# Patient Record
Sex: Male | Born: 1945 | Race: White | Hispanic: No
Health system: Southern US, Community
[De-identification: ages and names within clinical notes are randomized; demographics above are authoritative.]

---

## 2005-08-30 ENCOUNTER — Ambulatory Visit: Payer: Self-pay | Admitting: Pain Medicine

## 2005-11-11 ENCOUNTER — Ambulatory Visit: Payer: Self-pay | Admitting: Internal Medicine

## 2005-12-05 ENCOUNTER — Other Ambulatory Visit: Payer: Self-pay

## 2005-12-05 ENCOUNTER — Inpatient Hospital Stay: Payer: Self-pay | Admitting: Internal Medicine

## 2005-12-13 ENCOUNTER — Emergency Department: Payer: Self-pay | Admitting: Emergency Medicine

## 2006-05-14 ENCOUNTER — Emergency Department: Payer: Self-pay | Admitting: Emergency Medicine

## 2006-05-23 ENCOUNTER — Emergency Department (HOSPITAL_COMMUNITY): Admission: EM | Admit: 2006-05-23 | Discharge: 2006-05-23 | Payer: Self-pay | Admitting: Emergency Medicine

## 2006-06-09 ENCOUNTER — Ambulatory Visit: Payer: Self-pay | Admitting: Physician Assistant

## 2011-12-24 ENCOUNTER — Ambulatory Visit: Payer: Self-pay | Admitting: Family Medicine

## 2012-07-15 ENCOUNTER — Inpatient Hospital Stay: Payer: Self-pay | Admitting: Internal Medicine

## 2012-07-15 LAB — URINALYSIS, COMPLETE
Bacteria: NONE SEEN
Bilirubin,UR: NEGATIVE
Glucose,UR: NEGATIVE mg/dL (ref 0–75)
Ketone: NEGATIVE
Leukocyte Esterase: NEGATIVE
Nitrite: NEGATIVE
Ph: 6 (ref 4.5–8.0)
RBC,UR: NONE SEEN /HPF (ref 0–5)
Specific Gravity: 1.003 (ref 1.003–1.030)
Squamous Epithelial: NONE SEEN

## 2012-07-15 LAB — COMPREHENSIVE METABOLIC PANEL
BUN: 11 mg/dL (ref 7–18)
Bilirubin,Total: 0.8 mg/dL (ref 0.2–1.0)
Chloride: 99 mmol/L (ref 98–107)
EGFR (Non-African Amer.): >60
Osmolality: 271 (ref 275–301)
Potassium: 3.5 mmol/L (ref 3.5–5.1)
SGPT (ALT): 44 U/L (ref 12–78)
Sodium: 135 mmol/L — ABNORMAL LOW (ref 136–145)
Total Protein: 7.3 g/dL (ref 6.4–8.2)

## 2012-07-15 LAB — CBC
HGB: 12.9 g/dL — ABNORMAL LOW (ref 13.0–18.0)
MCHC: 34.5 g/dL (ref 32.0–36.0)
RDW: 12.3 % (ref 11.5–14.5)

## 2012-07-16 LAB — BASIC METABOLIC PANEL
Anion Gap: 7 (ref 7–16)
BUN: 8 mg/dL (ref 7–18)
Calcium, Total: 8.2 mg/dL — ABNORMAL LOW (ref 8.5–10.1)
Chloride: 101 mmol/L (ref 98–107)
Co2: 27 mmol/L (ref 21–32)
Creatinine: 0.98 mg/dL (ref 0.60–1.30)
EGFR (African American): >60
EGFR (Non-African Amer.): >60
Glucose: 98 mg/dL (ref 65–99)
Osmolality: 268 (ref 275–301)
Potassium: 3.5 mmol/L (ref 3.5–5.1)
Sodium: 135 mmol/L — ABNORMAL LOW (ref 136–145)

## 2012-07-16 LAB — CBC WITH DIFFERENTIAL/PLATELET
Basophil #: 0.1 10*3/uL (ref 0.0–0.1)
Eosinophil #: 0.3 10*3/uL (ref 0.0–0.7)
Lymphocyte #: 1.9 10*3/uL (ref 1.0–3.6)
MCH: 32.5 pg (ref 26.0–34.0)
MCV: 93 fL (ref 80–100)
Neutrophil %: 63.2 %
RDW: 12.1 % (ref 11.5–14.5)

## 2012-07-17 LAB — BASIC METABOLIC PANEL
BUN: 8 mg/dL (ref 7–18)
Calcium, Total: 8 mg/dL — ABNORMAL LOW (ref 8.5–10.1)
EGFR (African American): >60
Potassium: 3.3 mmol/L — ABNORMAL LOW (ref 3.5–5.1)

## 2012-07-17 LAB — CBC WITH DIFFERENTIAL/PLATELET
Basophil #: 0.1 10*3/uL (ref 0.0–0.1)
Eosinophil %: 5.9 %
HCT: 35.4 % — ABNORMAL LOW (ref 40.0–52.0)
Lymphocyte #: 2.3 10*3/uL (ref 1.0–3.6)
Lymphocyte %: 27.3 %
MCH: 32.3 pg (ref 26.0–34.0)
MCV: 94 fL (ref 80–100)
RBC: 3.79 10*6/uL — ABNORMAL LOW (ref 4.40–5.90)
RDW: 12.4 % (ref 11.5–14.5)

## 2012-07-17 LAB — VANCOMYCIN, TROUGH: Vancomycin, Trough: 15 ug/mL (ref 10–20)

## 2012-07-18 LAB — CBC WITH DIFFERENTIAL/PLATELET
Eosinophil #: 0.5 10*3/uL (ref 0.0–0.7)
HCT: 38.3 % — ABNORMAL LOW (ref 40.0–52.0)
MCH: 31.3 pg (ref 26.0–34.0)
MCHC: 33.5 g/dL (ref 32.0–36.0)
Monocyte %: 13.6 %
Neutrophil #: 4.7 10*3/uL (ref 1.4–6.5)
Neutrophil %: 54 %
Platelet: 320 10*3/uL (ref 150–440)
RDW: 12.4 % (ref 11.5–14.5)
WBC: 8.7 10*3/uL (ref 3.8–10.6)

## 2012-07-18 LAB — BASIC METABOLIC PANEL
BUN: 6 mg/dL — ABNORMAL LOW (ref 7–18)
Calcium, Total: 8.4 mg/dL — ABNORMAL LOW (ref 8.5–10.1)
Chloride: 104 mmol/L (ref 98–107)

## 2012-07-19 LAB — VANCOMYCIN, TROUGH: Vancomycin, Trough: 21 ug/mL (ref 10–20)

## 2012-08-29 ENCOUNTER — Encounter: Payer: Self-pay | Admitting: Cardiothoracic Surgery

## 2012-08-29 ENCOUNTER — Encounter: Payer: Self-pay | Admitting: Nurse Practitioner

## 2013-09-16 ENCOUNTER — Emergency Department: Payer: Self-pay | Admitting: Emergency Medicine

## 2013-09-22 ENCOUNTER — Emergency Department: Payer: Self-pay | Admitting: Internal Medicine

## 2013-12-13 ENCOUNTER — Emergency Department: Payer: Self-pay | Admitting: Emergency Medicine

## 2013-12-15 ENCOUNTER — Emergency Department: Payer: Self-pay | Admitting: Emergency Medicine

## 2014-01-06 ENCOUNTER — Emergency Department: Payer: Self-pay | Admitting: Emergency Medicine

## 2014-01-07 LAB — BASIC METABOLIC PANEL
Anion Gap: 5 — ABNORMAL LOW (ref 7–16)
BUN: 16 mg/dL (ref 7–18)
Calcium, Total: 8.3 mg/dL — ABNORMAL LOW (ref 8.5–10.1)
Chloride: 107 mmol/L (ref 98–107)
Co2: 28 mmol/L (ref 21–32)
Creatinine: 1.23 mg/dL (ref 0.60–1.30)
EGFR (Non-African Amer.): 60 — ABNORMAL LOW
GLUCOSE: 101 mg/dL — AB (ref 65–99)
OSMOLALITY: 281 (ref 275–301)
Potassium: 4.3 mmol/L (ref 3.5–5.1)
Sodium: 140 mmol/L (ref 136–145)

## 2014-01-07 LAB — CBC
HCT: 39.2 % — AB (ref 40.0–52.0)
HGB: 12.9 g/dL — AB (ref 13.0–18.0)
MCH: 32.7 pg (ref 26.0–34.0)
MCHC: 33 g/dL (ref 32.0–36.0)
MCV: 99 fL (ref 80–100)
PLATELETS: 169 10*3/uL (ref 150–440)
RBC: 3.97 10*6/uL — ABNORMAL LOW (ref 4.40–5.90)
RDW: 13.2 % (ref 11.5–14.5)
WBC: 5.9 10*3/uL (ref 3.8–10.6)

## 2014-01-07 LAB — TROPONIN I: Troponin-I: <0.02 ng/mL

## 2014-01-11 ENCOUNTER — Emergency Department: Payer: Self-pay | Admitting: Emergency Medicine

## 2014-01-11 LAB — CBC
HCT: 38.9 % — ABNORMAL LOW (ref 40.0–52.0)
HGB: 12.5 g/dL — AB (ref 13.0–18.0)
MCH: 31.8 pg (ref 26.0–34.0)
MCHC: 32.2 g/dL (ref 32.0–36.0)
MCV: 99 fL (ref 80–100)
PLATELETS: 159 10*3/uL (ref 150–440)
RBC: 3.93 10*6/uL — AB (ref 4.40–5.90)
RDW: 13 % (ref 11.5–14.5)
WBC: 6.6 10*3/uL (ref 3.8–10.6)

## 2014-01-11 LAB — URINALYSIS, COMPLETE
BLOOD: NEGATIVE
Bacteria: NONE SEEN
Bilirubin,UR: NEGATIVE
Glucose,UR: NEGATIVE mg/dL (ref 0–75)
Ketone: NEGATIVE
LEUKOCYTE ESTERASE: NEGATIVE
NITRITE: NEGATIVE
PH: 7 (ref 4.5–8.0)
PROTEIN: NEGATIVE
RBC,UR: 1 /HPF (ref 0–5)
SQUAMOUS EPITHELIAL: NONE SEEN
Specific Gravity: 1.008 (ref 1.003–1.030)
WBC UR: NONE SEEN /HPF (ref 0–5)

## 2014-01-11 LAB — BASIC METABOLIC PANEL
Anion Gap: 5 — ABNORMAL LOW (ref 7–16)
BUN: 13 mg/dL (ref 7–18)
CHLORIDE: 109 mmol/L — AB (ref 98–107)
CO2: 27 mmol/L (ref 21–32)
CREATININE: 1.17 mg/dL (ref 0.60–1.30)
Calcium, Total: 8 mg/dL — ABNORMAL LOW (ref 8.5–10.1)
EGFR (Non-African Amer.): >60
GLUCOSE: 70 mg/dL (ref 65–99)
OSMOLALITY: 280 (ref 275–301)
Potassium: 4.4 mmol/L (ref 3.5–5.1)
SODIUM: 141 mmol/L (ref 136–145)

## 2014-01-11 LAB — ETHANOL

## 2014-01-26 ENCOUNTER — Observation Stay: Payer: Self-pay | Admitting: Internal Medicine

## 2014-01-26 LAB — URINALYSIS, COMPLETE
BACTERIA: NONE SEEN
BILIRUBIN, UR: NEGATIVE
Glucose,UR: NEGATIVE mg/dL (ref 0–75)
Ketone: NEGATIVE
LEUKOCYTE ESTERASE: NEGATIVE
Nitrite: NEGATIVE
PH: 5 (ref 4.5–8.0)
Protein: NEGATIVE
RBC,UR: 2 /HPF (ref 0–5)
Specific Gravity: 1.023 (ref 1.003–1.030)
Squamous Epithelial: NONE SEEN
WBC UR: 3 /HPF (ref 0–5)

## 2014-01-26 LAB — CBC WITH DIFFERENTIAL/PLATELET
BASOS ABS: 0 10*3/uL (ref 0.0–0.1)
Basophil %: 0.2 %
EOS ABS: 0.1 10*3/uL (ref 0.0–0.7)
Eosinophil %: 0.6 %
HCT: 39 % — ABNORMAL LOW (ref 40.0–52.0)
HGB: 12.7 g/dL — ABNORMAL LOW (ref 13.0–18.0)
Lymphocyte #: 1.5 10*3/uL (ref 1.0–3.6)
Lymphocyte %: 6.9 %
MCH: 31.9 pg (ref 26.0–34.0)
MCHC: 32.6 g/dL (ref 32.0–36.0)
MCV: 98 fL (ref 80–100)
MONO ABS: 2.7 x10 3/mm — AB (ref 0.2–1.0)
Monocyte %: 11.9 %
NEUTROS PCT: 80.4 %
Neutrophil #: 17.9 10*3/uL — ABNORMAL HIGH (ref 1.4–6.5)
PLATELETS: 358 10*3/uL (ref 150–440)
RBC: 3.99 10*6/uL — AB (ref 4.40–5.90)
RDW: 13.4 % (ref 11.5–14.5)
WBC: 22.2 10*3/uL — ABNORMAL HIGH (ref 3.8–10.6)

## 2014-01-26 LAB — DRUG SCREEN, URINE
Amphetamines, Ur Screen: NEGATIVE (ref ?–1000)
Barbiturates, Ur Screen: NEGATIVE (ref ?–200)
Benzodiazepine, Ur Scrn: POSITIVE (ref ?–200)
CANNABINOID 50 NG, UR ~~LOC~~: NEGATIVE (ref ?–50)
Cocaine Metabolite,Ur ~~LOC~~: NEGATIVE (ref ?–300)
MDMA (ECSTASY) UR SCREEN: NEGATIVE (ref ?–500)
METHADONE, UR SCREEN: NEGATIVE (ref ?–300)
Opiate, Ur Screen: POSITIVE (ref ?–300)
PHENCYCLIDINE (PCP) UR S: NEGATIVE (ref ?–25)
TRICYCLIC, UR SCREEN: NEGATIVE (ref ?–1000)

## 2014-01-26 LAB — COMPREHENSIVE METABOLIC PANEL
ALBUMIN: 3.1 g/dL — AB (ref 3.4–5.0)
ALK PHOS: 93 U/L
Anion Gap: 9 (ref 7–16)
BUN: 40 mg/dL — ABNORMAL HIGH (ref 7–18)
Bilirubin,Total: 0.5 mg/dL (ref 0.2–1.0)
CHLORIDE: 96 mmol/L — AB (ref 98–107)
CO2: 26 mmol/L (ref 21–32)
Calcium, Total: 8.2 mg/dL — ABNORMAL LOW (ref 8.5–10.1)
Creatinine: 1.35 mg/dL — ABNORMAL HIGH (ref 0.60–1.30)
EGFR (African American): >60
EGFR (Non-African Amer.): 56 — ABNORMAL LOW
GLUCOSE: 104 mg/dL — AB (ref 65–99)
Osmolality: 273 (ref 275–301)
Potassium: 4 mmol/L (ref 3.5–5.1)
SGOT(AST): 38 U/L — ABNORMAL HIGH (ref 15–37)
SGPT (ALT): 22 U/L
Sodium: 131 mmol/L — ABNORMAL LOW (ref 136–145)
TOTAL PROTEIN: 7.3 g/dL (ref 6.4–8.2)

## 2014-01-26 LAB — TROPONIN I
Troponin-I: <0.02 ng/mL
Troponin-I: <0.02 ng/mL
Troponin-I: <0.02 ng/mL

## 2014-01-26 LAB — ETHANOL: Ethanol: <3 mg/dL

## 2014-01-26 LAB — HEMOGLOBIN A1C: Hemoglobin A1C: 5.6 % (ref 4.2–6.3)

## 2014-01-30 LAB — CULTURE, BLOOD (SINGLE)

## 2014-01-30 LAB — EXPECTORATED SPUTUM ASSESSMENT W GRAM STAIN, RFLX TO RESP C

## 2014-08-09 NOTE — Op Note (Signed)
PATIENT NAME:  Paul MaizesGILSON, Prem MR#:  [id number] DATE OF BIRTH:  Feb 02, 1946  DATE OF PROCEDURE:  07/16/2012  PREOPERATIVE DIAGNOSIS:  Sacral decubitus ulcer with necrosis.   POSTOPERATIVE DIAGNOSIS:  Sacral decubitus ulcer with necrosis.   PROCEDURE PERFORMED:  Debridement of sacral decubitus ulcer, 5 x 3 cm, as well as debridement of adjacent decubitus ulcer, 1 x 1 cm.   ANESTHESIA:  LMA general.   ESTIMATED BLOOD LOSS:  10 mL.   COMPLICATIONS:  None.   SPECIMENS:  None.   INDICATION FOR SURGERY:  The patient is a pleasant 69 year old male with history of a fall and immobility. He is noted to have a pressure ulcer which is causing him pain. He is brought to the operating room for debridement.   DETAILS OF PROCEDURE:  The patient was brought to the operating room suite. He was laid supine on the operating room table. He was induced. Endotracheal tube was placed. General anesthesia was administered. A timeout was then performed correctly identifying the patient's name, operative site and procedure to be performed. His legs were put in lithotomy. His buttocks were prepped with Betadine. The wound was debrided down to subcutaneous tissue. It was irrigated and hemostasis was obtained. Local anesthetic of 1% lidocaine was used to infiltrate the lesion. The drapes were then taken down. Sterile dressing was placed. The patient was awakened, LMA was removed and the patient was brought to the postanesthesia care unit. There were no immediate complications. Needle, sponge and instrument counts were correct at the end of the procedure.    ____________________________ Si Raiderhristopher A. Leilyn Frayre, MD cal:si D: 07/16/2012 21:21:45 ET T: 07/16/2012 22:04:15 ET JOB#: [id number]  cc: Cristal Deerhristopher A. Lileigh Fahringer, MD, <Dictator> Jarvis NewcomerHRISTOPHER A Sharonda Llamas MD ELECTRONICALLY SIGNED 07/24/2012 10:02

## 2014-08-09 NOTE — Consult Note (Signed)
PATIENT NAME:  Paul Meyer, Paul Meyer MR#:  [id number] DATE OF BIRTH:  22-Nov-1945  DATE OF CONSULTATION:  07/15/2012  REFERRING PHYSICIAN:   CONSULTING PHYSICIAN:  Idella Lamontagne A. Kaliopi Blyden, MD  REASON FOR CONSULTATION:  Possible perirectal abscess seen on CT and a right buttock decubitus ulcer in need of debridement.   HISTORY OF PRESENT ILLNESS:  The patient is a pleasant 69 year old who lives at home with a past medical history of hypertension and diabetes who is brought here by ER.  He is wheelchair-bound and called the ED and presumptively said that he fell to the floor, had pain in his right hip and also an area of buttock pain.  He is unable to give me a clear complaint right now, just says that he has buttock pain, was noted to have bruising on left buttocks and a decubital ulcer on his right buttocks.  When I asked him did not complain of any rectal pain otherwise.   REVIEW OF SYSTEMS:  Unable to obtain due to patient confusion.   PAST MEDICAL HISTORY:  1.  Diabetes.  2.  Hypertension.  3.  History of chronic back pain.  4.  Two right hip replacements.   HOME MEDICATIONS: 1.  Valium.  2.  Tiotropium.  3.  Protonix.  4.  Preparation H.  5.  Percocet.  6.  Nicotine patch. 7.  Methadone.  8.  Lisinopril. 9.  Lescol. 10.  Colace.  11.  Celexa.  12.  Dulcolax.  13.  Advair Diskus.   ALLERGIES:  1.  NEURONTIN. 2.  BENADRYL.   SOCIAL HISTORY:  Unable to obtain, but from notes, lives at home and he is in a wheelchair.  Tobacco, alcohol use unable to obtain.   FAMILY HISTORY:  Unable to obtain.   PHYSICAL EXAMINATION: VITAL SIGNS:  Temperature 97.8, pulse of 84, blood pressure 197/106, respirations 18, 95% on room air.  GENERAL:  Confused, unable to answer questions appropriately.  No acute distress.  HEAD:  Normocephalic, atraumatic.  EYES:  No scleral icterus.  No conjunctivitis.  FACE:  No obvious facial trauma.  Normal external nose.  Normal external ears.  CHEST:  Lungs  clear to auscultation, moving air well.  HEART:  Regular rate and rhythm.  No murmurs, rubs, or gallops.  ABDOMEN:  Soft, nontender, nondistended.  BUTTOCK:  Large bruise to left buttocks, has an approximately 5 x 3 cm area of multiple decubitus ulcers with some necrotic tissue and a potential inferior abscess.   RECTAL:  Normal rectal exam.  Unable to palpate a mass.  Prostate feels normal.  Unable to elicit any pain with rectal exam.  EXTREMITIES:  Moves all extremities well.  Strength 5 out of 5.  NEUROLOGIC:  Cranial nerves II through XII are grossly intact.  Moves all extremities.   LABORATORY AND RADIOLOGIC STUDIES:  Labs are remarkable for a white cell count of 10.0, hemoglobin 12.9, hematocrit 37.2, platelets 309, otherwise labs are unremarkable.   CT shows a soft tissue density in left perirectal region above the levators high on the rectum.  Does not appear to be fluid-filled, but per radiologist was read as perirectal abscess.   ASSESSMENT AND PLAN:  The patient is a pleasant 69 year old male with history of diabetes, high blood pressure, comes in after a presumed fall, has a right buttock decubitus ulcer which is in need of debridement sometime when his encephalopathy has improved, if it is to improve.  Also, has a soft tissue density in his left area  which is nonpalpable and not able to be surgically drained, but appears to be above the levators, may consider interventional radiology draining and antibiotics, however he is asymptomatic at this time from this lesion.  We will continue to follow for possible future debridement.     ____________________________ Si Raider. Jane Broughton, MD cal:ea D: 07/15/2012 22:27:05 ET T: 07/15/2012 23:30:19 ET JOB#: [id number]  cc: Cristal Deer A. Opal Dinning, MD, <Dictator> Jarvis Newcomer MD ELECTRONICALLY SIGNED 07/16/2012 18:42

## 2014-08-09 NOTE — H&P (Signed)
DATE OF BIRTH:  [birth year]  DATE OF ADMISSION:  07/15/2012  PRIMARY CARE PHYSICIAN:  Dr. Lorie Phenix  CHIEF COMPLAINT:  Perirectal pain.   HISTORY OF PRESENT ILLNESS: A 69 year old male patient presented to the Emergency Room, brought in by EMS complaining of perirectal pain. The patient has had episodes of confusion in the hospital. Presently he is unable to contribute to the history, except his pointing at the pain. The patient was found to have a perirectal abscess along with a decubitus ulcer. Dr. Juliann Pulse from Surgery saw the patient, who did not feel this could be drained. He felt IV antibiotics would help, and a consultation with IR to consider drainage. The patient is being admitted for the same. The patient does not complain of anything or raise concerns, other than his perirectal pain. Initially when he presented to the Emergency Room, the patient did complain that he fell off his chair a few days back. The patient is mostly bedbound, and moves around in his wheelchair. His baseline status is unknown. Old records have been reviewed.   PAST MEDICAL HISTORY:  COPD, hypertension, diabetes, tobacco abuse, hyperlipidemia, depression, chronic low back pain, on narcotic medications, right hip replacement, tonsillectomy,  GERD, spinal stenosis.   ALLERGIES:  No known drug allergies.   FAMILY HISTORY:  Reviewed and unobtainable.   SOCIAL HISTORY: Old records mention that patient smoked a pack a day.   REVIEW OF SYSTEMS:  Unobtainable, except patient mentioning pain in the perirectal area.   HOME MEDICATIONS INCLUDE: 1.  Advair Diskus 250/50, 1 puff inhaled twice a day.  2.  Bisacodyl 10 mg rectal suppository as needed once a day.  3.  Celexa 10 mg oral once a day. 4.  Colace 100 mg oral 2 times a day.  5.  Lescol 20 mg oral once a day. 6.  Lisinopril 20 mg oral 2 times a day.  7.  Methadone 10 mg oral every 6 hours.  8. Protonix 40 mg oral once a day.  9. Spiriva 18 mcg inhaled  once a day.   FAMILY HISTORY:  Reviewed, unknown.   PHYSICAL EXAMINATION: VITAL SIGNS: Temperature 97.8, pulse of 88, respirations 16, blood pressure 153/95, saturating 94% on room air.  GENERAL:  Obese Caucasian male patient lying in bed, confused, restless.  PSYCHIATRIC:  Is confused, but not agitated. She is alert and awake.  HEENT:  Atraumatic, normocephalic. Oral mucosa dry and pink. External ears and nose normal. No pallor. No icterus. Pupils bilaterally equal and react to light.  NECK: Supple. No thyromegaly. No palpable lymph nodes. Trachea midline. No carotid bruit, JVD.  CARDIOVASCULAR: S1, S2. Regular rate and rhythm, without any murmurs. Peripheral pulses decreased. RESPIRATORY:  Normal work of breathing. Clear to auscultation on both sides.  GASTROINTESTINAL: Soft abdomen, nontender. Bowel sounds present. No hepatosplenomegaly palpable.  GENITOURINARY:  No CVA tenderness or bladder distention.  SKIN:  Warm and dry. Has a large decubitus ulcer in his gluteal fold  measuring about 2 x 4 cm deep, with some purulent discharge. Surrounding erythema.  MUSCULOSKELETAL:  No joint swelling, redness, effusion over the large joints. Normal muscle tone.  NEUROLOGICAL: Moves all 4 extremities symmetrically. Cranial nerves II to XII intact. LYMPHATIC: No cervical lymphadenopathy.   LAB STUDIES:  Show BNP of 312. Glucose 120, BUN 11, creatinine 0.92, sodium 135, potassium 3.5. AST, ALT, alkaline phosphatase normal. Albumin 2.3. Troponin less than 0.02. CK of 151. WBC 10, hemoglobin 12.9, platelets of 309. Urinalysis shows no bacteria, no WBC.  CT scan of the abdomen and pelvis, preliminary result, shows a perirectal abscess. No other acute abnormality found.   ASSESSMENT AND PLAN:  1.  Perirectal abscess, which is not amenable to surgical intervention. Case discussed with Dr. Juliann PulseLundquist. Will start patient on IV antibiotics, considering his associated acute encephalopathy. Will also need  consultation with Interventional Radiology in the morning to see if this can be drained. The patient also has a decubitus ulcer, which might need debridement. Will get blood cultures.   2.  Acute encephalopathy, likely secondary from the abscess, although patient's baseline status is not known. Will get a CT scan of the head to look for any acute bleed or stroke, considering his recent fall.   3.  Hypertension. Continue home medications. Use IV p.r.n. meds.   4.  Chronic obstructive pulmonary disease. Continue home inhalers and nebs p.r.n.   5.  Diabetes mellitus. Patient is, surprisingly, not on any diabetes medications at home. Will put him on a sliding scale insulin, diabetic diet, and check a HbA1c.   6.  Deep vein thrombosis prophylaxis with heparin.   7.  Code status:  Presumed FULL CODE.   Time spent today on this case was 45 minutes.     ____________________________ Molinda BailiffSrikar R. Aadan Chenier, MD srs:mr D: 07/15/2012 21:42:15 ET T: 07/15/2012 22:24:09 ET JOB#: [id number]  cc: Wardell HeathSrikar R. Elpidio AnisSudini, MD, <Dictator> Leo GrosserNancy J. Maloney, MD  Orie FishermanSRIKAR R Donshay Lupinski MD ELECTRONICALLY SIGNED 07/24/2012 15:00

## 2014-08-09 NOTE — Consult Note (Signed)
Brief Consult Note: Diagnosis: Depression and anxiety, Delirium secondary to medical condition/medications resolved.   Patient was seen by consultant.   Consult note dictated.   Recommend further assessment or treatment.   Comments: [name] does have the capacity to make decisions about his disposition. Please discharge as appropriate.  Electronic Signatures: Kristine LineaPucilowska, [name] (MD)  (Signed 04-Apr-14 19:29)  Authored: Brief Consult Note   Last Updated: 04-Apr-14 19:29 by Kristine LineaPucilowska, Rufus Beske (MD)

## 2014-08-09 NOTE — Discharge Summary (Signed)
PATIENT NAME:  Paul Meyer, Paul Meyer MR#:  [id number] DATE OF BIRTH:  [birth year]  DATE OF ADMISSION:  07/15/2012 DATE OF DISCHARGE:  07/21/2012  ADMITTING DIAGNOSIS: Perirectal pain.   DISCHARGE DIAGNOSES: 1.  Perirectal pain due to a nonoperable perirectal abscess as well as an ulcer in that region status post evaluation by surgery, recommended antibiotics, were unable to drain it.  2.  Acute encephalopathy felt to be due to acute infection and felt to be metabolic in nature.  The patient's delirium is improved.  3.  Hypertension.  4.  Chronic obstructive pulmonary disease.  5.  Diet-controlled diabetes.  6.  Chronic pain in the back with difficulty with ambulation.  7.  Tobacco abuse.  8.  Depression.  9.  Status post right hip replacement. 10.  Status post tonsillectomy.  11.  Gastroesophageal reflux disease.  12.  History of spinal stenosis.  13.  Abnormality noted in the left gluteal region.  The patient refused to get a second CT scan with intravenous contrast.    PERTINENT LABS AND EVALUATIONS: Admitting BMP: Glucose 120, BUN 11, creatinine 0.92, sodium was 135, potassium 3.5, chloride 99 and CO2 was 30. Hemoglobin A1c 5.6. LFTs were normal, except albumin of 2.3. CK-MB was 4.0. WBC 10.0, hemoglobin 12.9 and platelet count 309.   Blood cultures x 2 no growth.   EKG showed sinus rhythm without any ST-T wave changes.   CT scan of the head showed chronic and involutional changes without any acute abnormality.   CT of his pelvis showed left perirectal collection, possibly an abscess although hematoma cannot be excluded. Abnormality noted in the left gluteal musculature. May be infection or mass. Further evaluation with IV contrast recommended. No evidence of fracture. Abnormality noted in the left gluteal region. The patient refused to get day second CT scan. With IV contrast.   CONSULTANT: Ida Rogue, MD; case management.   HOSPITAL COURSE: Please refer to H and P done by the  admitting physician. The patient is a 69 year old white male who resides by himself at home, reports that he normally gets around in a wheelchair, but he has been able to ambulate here in the hospital with unsteady gait, who presented with severe perirectal pain. Surgery saw him initially and thought that this could be drained. We were asked to admit him for infection as well as abscess. The patient was started on broad-spectrum antibiotics. He was in severe pain requiring IV pain medications. He continued to require them. Also in terms of this perirectal lesion, he was seen by surgery. He was taken under anesthesia to the OR, but they were unable to drain that area. He was recommended to continue antibiotics. The patient's perirectal abscess and ulceration have significantly improved. He was switched over to oral medications. He also developed acute encephalopathy during hospitalization with severe agitation, probably due to infection. That has improved as well. He continues to have difficulty with ambulation. He is still getting wound care. He lives alone but did not want to go straight to nursing home. He requests transfer to the [place], which is currently being arranged.   DISCHARGE MEDICATIONS: 1.  Preparation H 3 times a day as needed. 2.  Celexa 10 mg daily. 3.  Protonix 40 mg daily. 4.  Lescol 20 mg 1 tab p.o. at bedtime. 5.  Lisinopril 20 mg 1 tab p.o. b.i.d. 6.  Methadone 10 mg 1 tab p.o. q. 6. 7.  Advair 250/50 one inhalation b.i.d. 8.  Nicotine patch 14 mg topically  daily. 9.  Colace 100 mg 1 tab p.o. b.i.d. 10.  Bisacodyl 1 suppository rectally daily. 11.  Spiriva 18 mcg daily. 12.  Tylenol 650 mg q. 4 p.r.n.  13.  Oxycodone 15 mg 1 tab p.o. q. 12, extended release. 14.  Augmentin 875 mg 1 tab p.o. b.i.d. x 7 days. 15.  Doxycycline 100 p.o. q. 12 hours x 7 days.  16.  Valium 5 mg 4 times per day as needed.  DRESSING CARE:  Wet to dry dressing on the buttock area ulceration daily.    DIET: Low sodium, low fat, low cholesterol, carbohydrate consistent.  ACTIVITY: As tolerated. PT and OT evaluation and treatment.  DISPOSITION:  [facility], [facility] 7-10 days  Referal : home health/pt/case manager  TIME SPENT:  35 minutes. ____________________________ Lacie ScottsShreyang H. Allena KatzPatel, MD shp:sb D: 07/21/2012 13:46:17 ET T: 07/21/2012 14:13:43 ET JOB#: [id number]  cc: Cristan Hout H. Allena KatzPatel, MD, <Dictator> Charise CarwinSHREYANG H Taggart Prasad MD ELECTRONICALLY SIGNED 07/22/2012 8:24

## 2014-08-09 NOTE — Consult Note (Signed)
DATE OF BIRTH:  22-Jun-1945  DATE OF ADMISSION:  07/15/2012  DATE OF CONSULTATION:  07/21/2012  REQUESTING PHYSICIAN:  Dr. Milagros Loll  CONSULTING PHYSICIAN:  Iliani Vejar B. Katrina Daddona, MD  REASON FOR CONSULTATION:  To assess the capacity to make decisions about disposition.   IDENTIFYING DATA:   The patient is a 69 year old male with a history of depression.   CHIEF COMPLAINT:  "I need to go home."   HISTORY OF PRESENT ILLNESS:  The patient is a veteran and has [facility] privileges, where he receives all his treatment. He reports that he has a history of depression and anxiety, and has been maintained at the [place] on Valium that he takes occasionally. On the list of his medications, there is also low-dose Celexa. He was admitted to the hospital for a perianal abscess that could not be drained, and was treated with antibiotics. The patient also reportedly experienced some symptoms suggestive of delirium that now has resolved. The patient wants to be discharged to home. The treating team believes that the patient needs to stay in the hospital longer. They offered him a choice of being transferred to a [facility] or a skilled nursing facility. The patient denied both, and insists that he will go home. He feels that he did not receive help he needed here, and does not feel that hospitalization here or elsewhere is necessary. He lives by himself, has a friend who reportedly helps him with groceries. He no longer drives, and does not have a car. He is disabled from back pain and hip problems, and uses a wheelchair around the house. He is confident that he can return home as usual. He denies any symptoms of depression, anxiety or psychosis. He denies alcohol, illicit drugs or prescription pill abuse.   PAST PSYCHIATRIC HISTORY:  There is a history of depression and anxiety. Some of it is related to difficulty growing up. He lost his parents very early Took care of himself. Was in the [employer] at the time of  [place] War, and made a comment that he was saving people instead of killing people. He denies ever attempting suicide. No history of substance abuse.   PAST MEDICAL HISTORY:  COPD, hypertension, diabetes, perirectal abscess.   ALLERGIES:  BENADRYL and NEURONTIN.   MEDICATIONS ON ADMISSION:  Advair Diskus 250/50 twice daily, bisacodyl 10 mg daily, Celexa 10 mg daily, Colace 100 mg twice daily, Lescol 20 mg daily, lisinopril 20 mg twice daily, methadone 10 mg every 6 hours, Protonix 40 mg daily, Spiriva 18 mcg daily.   MEDICATIONS AT THE TIME OF ASSESSMENT:  Tylenol 650 as needed, citalopram 10 mg daily, Colace 100 mg twice daily, Advair Diskus 250/50 twice daily, fluvastatin 20 mg at bedtime, heparin 5000 units twice daily, hydralazine injections as needed, insulin sliding scale, lisinopril 20 mg twice daily, methadone 10 mg every 6 hours Zofran as needed, pantoprazole 40 mg daily, Spiriva 1 capsule daily, morphine injection as needed, Geodon injection as needed, OxyContin 15 mg every 12 hours, Augmentin 875 mg twice daily, doxycycline 100 mg twice daily.  SOCIAL HISTORY:  As above, the patient is a [place] veteran. He lives by himself. He has difficulties ambulating, and uses a wheelchair at home. He reportedly has a friend who helps him out. He has other friends that he could engage if necessary. He knows some "ladies" who would take care of him if he pays them. He feels that he has resources to do so. He would welcome home health nurse  as well.   REVIEW OF SYSTEMS: CONSTITUTIONAL:  No fevers or chills. No weight changes.  EYES:  No double or blurred vision.  EARS, NOSE, THROAT:  No hearing loss.  RESPIRATORY:  No shortness of breath or cough.  CARDIOVASCULAR:  No chest pain or orthopnea.  GASTROINTESTINAL:  No abdominal pain, nausea, vomiting or diarrhea. Positive for perirectal pain.  GENITOURINARY:  No incontinence or frequency.  ENDOCRINE:  No heat or cold intolerance.  LYMPHATIC:  No  anemia or easy bruising.  INTEGUMENTARY:  No acne or rash.  MUSCULOSKELETAL:  Positive for back pain.  NEUROLOGIC:  No tingling or weakness.  PSYCHIATRIC:  See History of Present Illness for details.   PHYSICAL EXAMINATION: VITAL SIGNS:  Blood pressure 166/74, pulse 68, respirations 18, temperature 98.5.  GENERAL: This is a well-developed male in no acute distress.  The rest of the physical examination is deferred to his primary attending.   LABORATORY DATA:  Chemistries within normal limits, except for blood glucose of 120. Sodium 135. LFTs within normal limits. Cardiac enzymes negative. CBC:  White blood count 10.1, hemoglobin 11.9, hematocrit 34.1, platelets 305. No blood culture growth. Urinalysis is not suggestive of urinary tract infections.   EKG:  Sinus rhythm with premature atrial complexes. Otherwise normal EKG.   MENTAL STATUS EXAMINATION:  The patient is alert and oriented to person, place, time and situation. He is pleasant, polite and cooperative. He is sitting in a chair wearing private clothes. He is well kept. He maintains good eye contact. His speech is of normal rhythm, rate and volume. Mood is fine, with full affect. Thought processing is logical and goal-oriented. Thought content:  He denies suicidal or homicidal ideation. There are no delusions or paranoia. There are no auditory or visual hallucinations. His cognition is grossly intact. He registers 3 out of 3 and recalls 3 out of 3 objects after 5 minutes. He can spell "world" forward and backward. He knows the current president. His insight and judgment are fair.   SUICIDE RISK ASSESSMENT: This is a patient with a long history of mild depression and anxiety, who was admitted for perianal abscess, treated with antibiotics, and reportedly experienced some delirium, who is now resolved. He is forward-thinking and optimistic about the future.   DIAGNOSES: AXIS I: Major depressive disorder, recurrent, in remission. Delirium  secondary to medical condition/medications, resolved.  AXIS II:  Deferred.   AXIS III:   Chronic obstructive pulmonary disease. Gastroesophageal reflux disease. Back pain.  Perianal abscess. Hypertension   AXIS IV:   Physical illness, primary support.   AXIS V:  GAF 55.   PLAN:   1.  The patient does not meet criteria for involuntary inpatient psychiatric commitment. Please discharge as appropriate.  2.  He does have the capacity to make decision about his disposition.  3.  No medication adjustments recommended.  4.  The patient [facility].    ____________________________ Winda Summerall B. Jennet MaduroPucilowska, MD jbp:mr D: 07/21/2012 19:26:00 ET T: 07/21/2012 22:31:42 ET JOB#: [id number]  cc: Durand Wittmeyer B. Jennet MaduroPucilowska, MD, <Dictator> Shari ProwsJOLANTA B Roniel Halloran MD ELECTRONICALLY SIGNED 07/31/2012 21:39

## 2014-08-10 NOTE — H&P (Signed)
PATIENT NAME:  Paul Meyer, Paul Meyer MR#:  [id number] DATE OF BIRTH:  02-May-1945  DATE OF ADMISSION:  01/26/2014  REFERRING PHYSICIAN: Governor Rooks, MD  PRIMARY CARE PHYSICIAN: Lorie Phenix, MD  ADMISSION DIAGNOSIS: Left lower lobe pneumonia and altered mental status.   HISTORY OF PRESENT ILLNESS: This is a 69 year old Caucasian male who presents to the Emergency Department via EMS. He was found [place] at a [place] motel where bystanders reported that he was initially unresponsive. They did not start basic life support, but called 911 where first responders noted that he indeed was responsive, but certainly altered if not groggy. He was brought to the Emergency Department, where a chief complaint was not really discernible from the patient's own words, which are garbled. He initially seemed to be in mild respiratory distress, which prompted the Emergency Department staff to obtain a chest x-ray that showed left lower lobe pneumonia. Due to his altered mental status and pneumonia, we were called for admission.   REVIEW OF SYSTEMS: The patient is not cooperating with physical exam or answering questions. It is clear that he complains of some back pain and mentions something about "spinal", which I assume is a reference to spinal stenosis that I have gathered from his old records. He does not participate in a full review of systems.   PAST MEDICAL HISTORY: Gleaned from previous medical records, hypertension, diabetes type 2, COPD, hyperlipidemia, depression, tobacco abuse and chronic narcotic medication use.   PAST SURGICAL HISTORY: As taken from old records, right hip replacement as well as tonsillectomy.   SOCIAL HISTORY: The patient is a smoker. Nothing else is known about his acquaintances and  social circumstances at this time.   FAMILY HISTORY: Not available at this time.   MEDICATIONS:  1. Acetaminophen 325 mg 2 tabs p.o. every 4 hours as needed for temperature greater than 100.4.  2.  Acetaminophen/oxycodone 325 mg/5 mg oral tablet 1 tab every 6 hours as needed for pain.  3. Advair Diskus 250 mcg/50 mcg inhaled powder 1 capsule inhaled b.i.d.  4. Atorvastatin 40 mg 1 tab p.o. daily at bedtime.  5. Bactrim Double Strength 800 mg/160 mg oral tablet 1 tab p.o. b.i.d. presumably x 10 days.  6. Bisacodyl 10 mg rectal suppository 1 per day at bedtime as needed for constipation.  7. Celexa 10 mg 1 tab p.o. daily.  8. Colace 100 mg 1 capsule p.o. b.i.d.  9. Cyanocobalamin 1000 mcg tablet, 1 tablet p.o. daily.  10. Lescol 20 mg oral capsule 1 capsule p.o. daily at bedtime.  11. Lisinopril 20 mg 1 tab p.o. b.i.d.  12. Metoprolol 50 mg 1 tab p.o. b.i.d.  13. Olanzapine 10 mg 1 tab p.o. once daily.   ALLERGIES: BENADRYL AND NEURONTIN.   PERTINENT LABORATORY RESULTS AND RADIOGRAPHIC FINDINGS: Glucose 104, BUN 40, creatinine 1.35, sodium 131, potassium 4,  chloride 96, CO2 26, calcium is 8.2, serum albumin is 3.1, alkaline phosphatase 93, AST 38, ALT 22. Troponin is negative. White blood cell count is 22.2, hemoglobin is 12.7, hematocrit is 39, platelet count is normal. Urinalysis is also negative for infection. Chest x-ray shows left lower lobe infiltrate consistent with pneumonia as well as COPD and bibasilar densities most likely to be overlying shadows of soft tissues on the chest. The CT scan of his head without contrast shows no acute intracranial abnormality, but he does have some mild sinusitis involving the ethmoidal and right axillary sinus.   PHYSICAL EXAMINATION:  VITAL SIGNS: Temperature is 100.1, pulse 99, respirations  16, blood pressure 129/58, pulse oximetry is 94 to 97% on 2 liters oxygen via nasal cannula.   GENERAL: The patient is somnolent and oriented to person, but it is unclear if he is oriented to place or situation. He does not appear to be in any distress, but certainly looks uncomfortable and has a posture similar to somebody who has a hemiparesis.  HEENT:  Normocephalic, atraumatic. Pupils equal, round, and reactive to light and accommodation. Extraocular movements are intact, although be it noted that the patient's eyes are somewhat rolled upward into his head but he will focus if aroused significantly. Mucous membranes are dry.  NECK: Trachea is midline. No adenopathy.  CHEST: Symmetric, atraumatic.  CARDIOVASCULAR: Regular rate and rhythm. Normal S1, S2. No rubs, clicks, murmurs appreciated.  LUNGS: Clear to auscultation bilaterally. Normal effort and excursion.  ABDOMEN: Positive bowel sounds. Soft, nondistended mildly, but diffusely tender with no rebound tenderness or voluntary guarding.  GENITOURINARY: Normal external male genitalia.  MUSCULOSKELETAL: The patient moves all 4 extremities equally but he does not participate in strength testing.  SKIN: No rashes or lesions.  NEUROLOGIC: Cranial nerves II through V appears to be grossly intact. The patient did not participate in full exam.  PSYCHIATRIC: Mood is frustrated and affect is congruent.   ASSESSMENT AND PLAN: This is a 69 year old Caucasian male who appears dramatically older than his stated age who was found in a decreased responsive state who currently has a left lower lobe pneumonia.   1. Pneumonia of left lower lobe, possibly aspiration pneumonia as here in the Emergency Department the patient has some dried vomit on his bedsheets. It is likely secondary to his altered mental status, the etiology of which is unknown at this time. Initially the Emergency Department had given him some Rocephin and azithromycin, but due to his overall appearance one might extrapolate that his state of health is somewhat immunocompromised so I have added vancomycin to his regimen as well. We will supply supplemental oxygen as needed to maintain oxygen saturations greater than 94%.  2. Altered mental status, unclear if this is due to hypoxia secondary to pneumonia or if the patient has some toxicity that is  currently unknown as we are not able to obtain a urine toxicology screen. His ethanol level was notably low at 3. I have ordered a dose of naloxone for the patient here in the Emergency Department to see if this improves his mental status. If the patient is still mildly obtunded in the morning the primary care team may wish to order an MRI of his brain.  3. Sepsis. The patient meets criteria for sepsis due to respiratory rate and leukocytosis. We have obtained blood cultures and will follow growth and sensitivities to change antibiotic coverage as needed.  4. Hyponatremia. Likely secondary to volume depletion as the patient does not appear to take care of himself well. I will give him intravenous fluid at maintenance rate.  5. Acute kidney injury secondary to above. Current GFR is 56. Once we get the patient back to being euvolemic we will recheck his kidney function.  6. Chronic obstructive pulmonary disease. Restart Advair once the patient's  mental status is more alert. Currently he has excellent air movement and no wheezes on physical examination, but if need be we will do Duoneb treatments in addition to his maintenance inhaler.  7. Diabetes mellitus type 2. Currently patient's sugar is only mildly elevated. I will add sliding scale insulin regimen to his inpatient medications.  8. Hyperlipidemia. Continue statin once the patient is able to take medications by mouth.  9. Tobacco abuse. I will add NicoDerm patch for the patient while he is hospitalized.  10. Depression. Currently the patient is on olanzapine at home, but for the time being, he will receive  Haldol as naloxone has made him very active and mildly combative with nursing staff.  11. Deep vein thrombosis prophylaxis. Subcutaneous heparin.  12. Gastrointestinal prophylaxis unnecessary as the patient is not critically ill.   CODE STATUS: The patient is currently a full code.   TIME SPENT ON ADMISSION ORDERS AND PATIENT CARE:  Approximately 30 minutes.      ____________________________ Kelton Pillar. Sheryle Hail, MD msd:JT D: 01/26/2014 03:11:42 ET T: 01/26/2014 04:38:34 ET JOB#: [id number]  cc: Kelton Pillar. Sheryle Hail, MD, <Dictator> Kelton Pillar Sola Margolis MD ELECTRONICALLY SIGNED 01/27/2014 0:08

## 2014-08-10 NOTE — Discharge Summary (Signed)
PATIENT NAME:  Paul Meyer, Paul Meyer MR#:  [id number] DATE OF BIRTH:  06-05-1945  DATE OF ADMISSION:  01/26/2014 DATE OF DISCHARGE:  01/27/2014  The patient was admitted on October 10 for community-acquired pneumonia and ambulatory dysfunction. He apparently left AMA on this evening.    ____________________________ Marena Witts P. Juliene PinaMody, MD spm:TT D: 01/27/2014 11:06:14 ET T: 01/27/2014 15:29:32 ET JOB#: [id number]  cc: Terralyn Matsumura P. Juliene PinaMody, MD, <Dictator> Janyth ContesSITAL P Xayne Brumbaugh MD ELECTRONICALLY SIGNED 01/27/2014 21:25

## 2014-12-19 ENCOUNTER — Other Ambulatory Visit: Payer: Self-pay | Admitting: Family Medicine

## 2015-01-19 IMAGING — CR LEFT WRIST - COMPLETE 3+ VIEW
1 series · 3 of 3 positions shown · non-contrast
Comparison: None.

CLINICAL DATA: Found down on floor, initially unresponsive. Left
wrist pain. Initial encounter.

EXAM:
LEFT WRIST - COMPLETE 3+ VIEW

[Series 1: pa · 0.17mm/px · 3 of 3 slices shown]
[im 1/3]
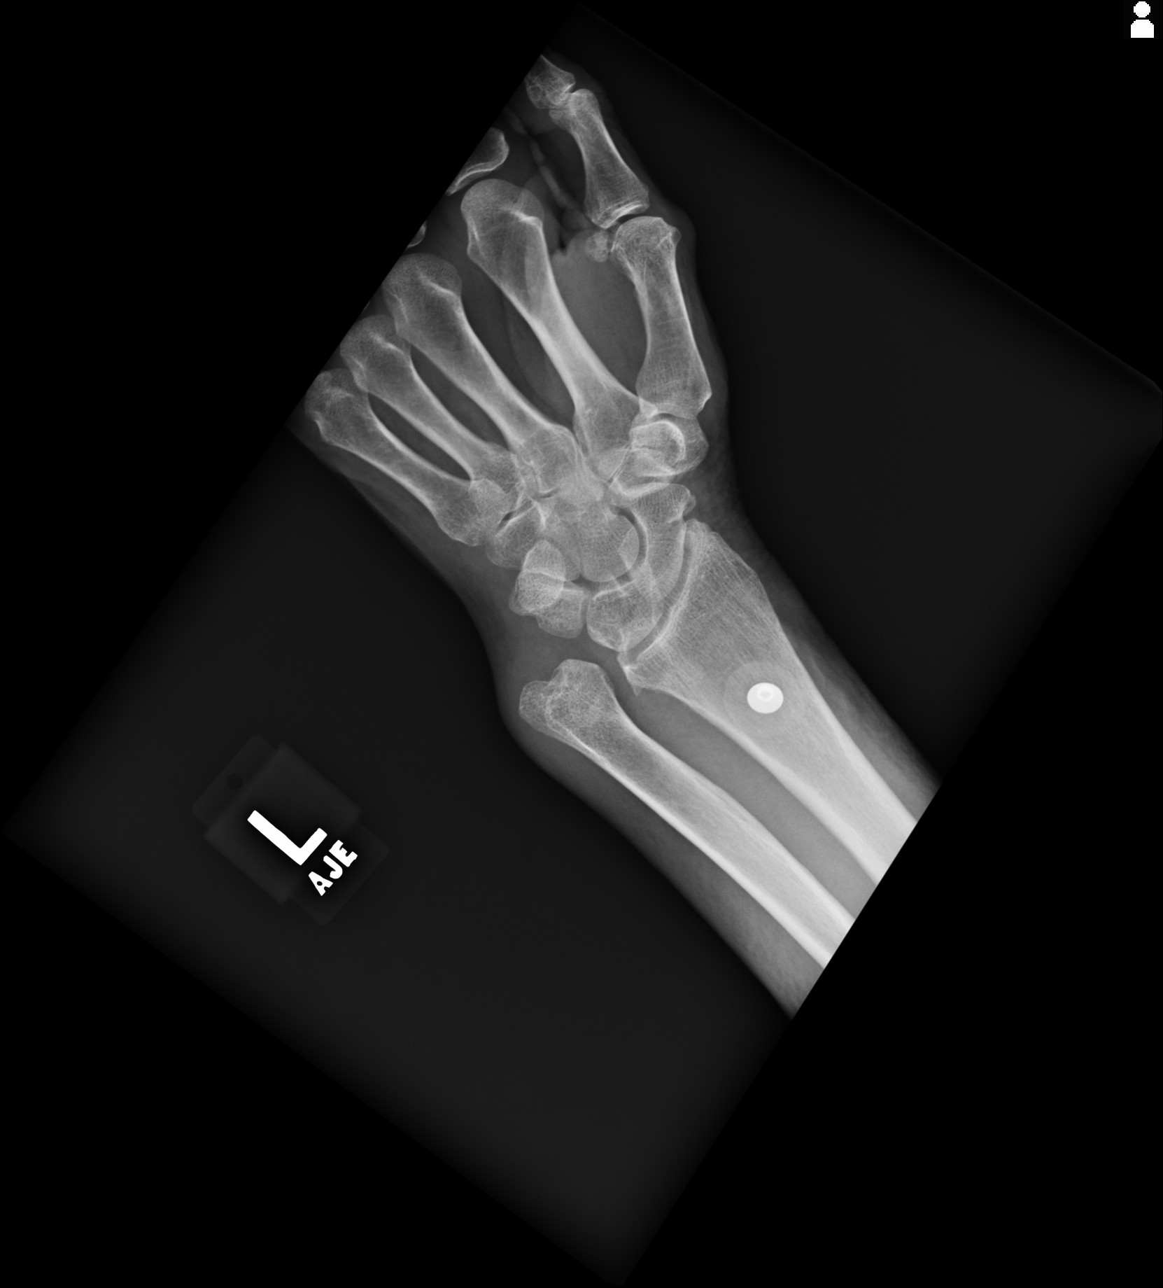
[im 2/3]
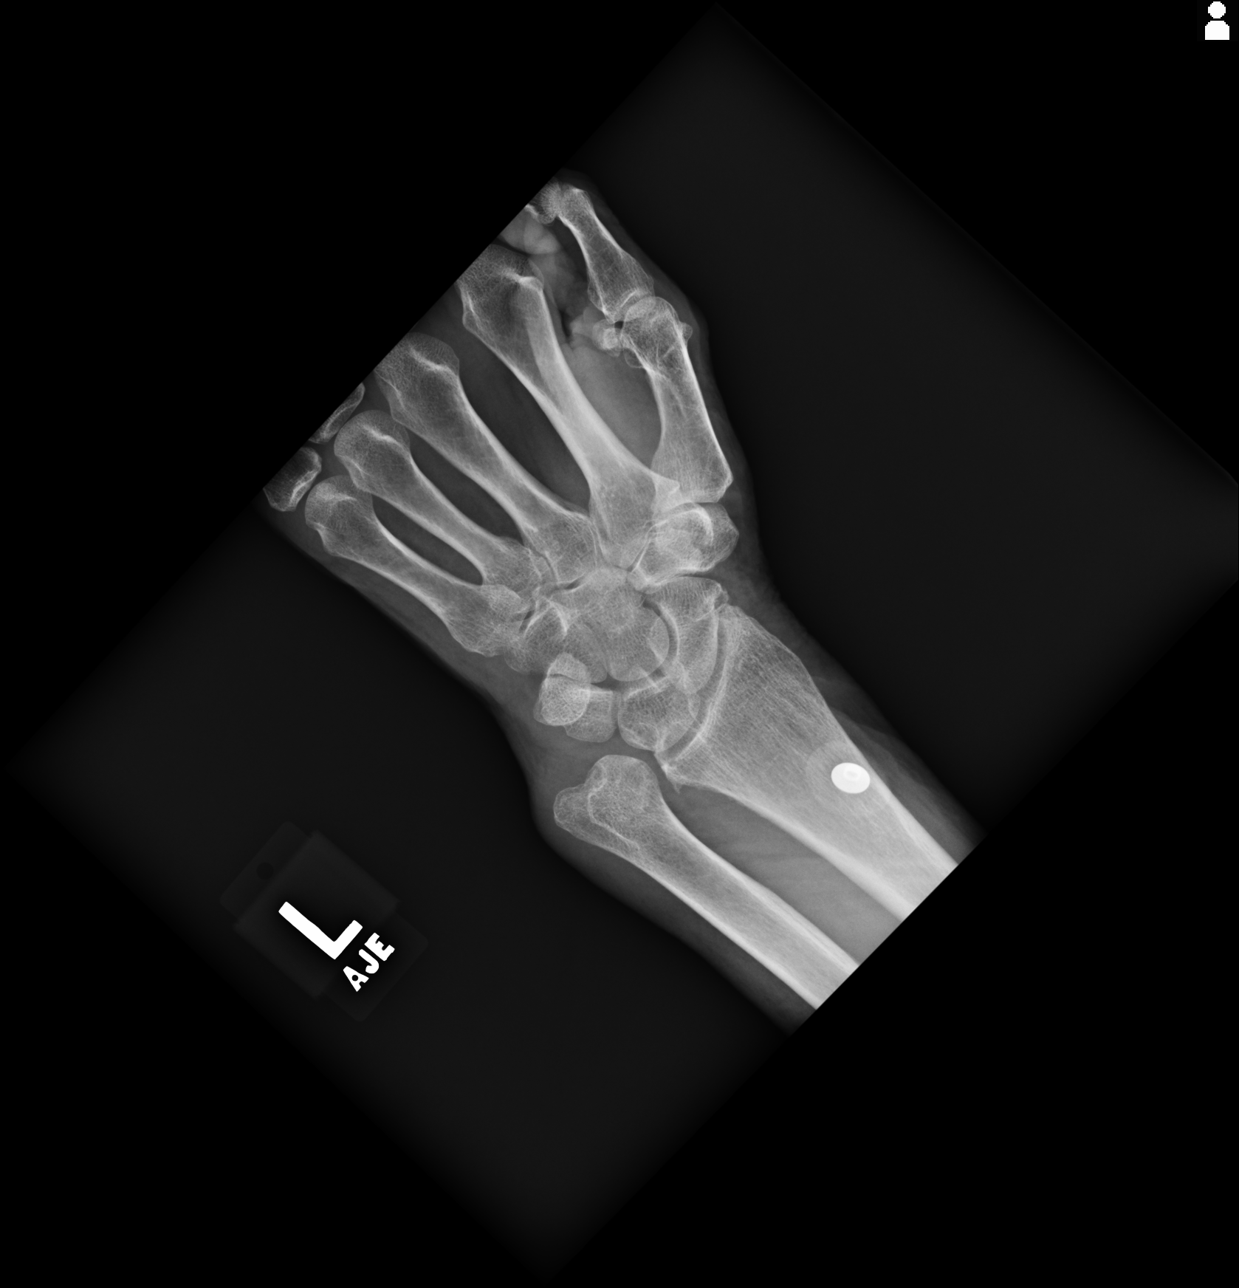
[im 3/3]
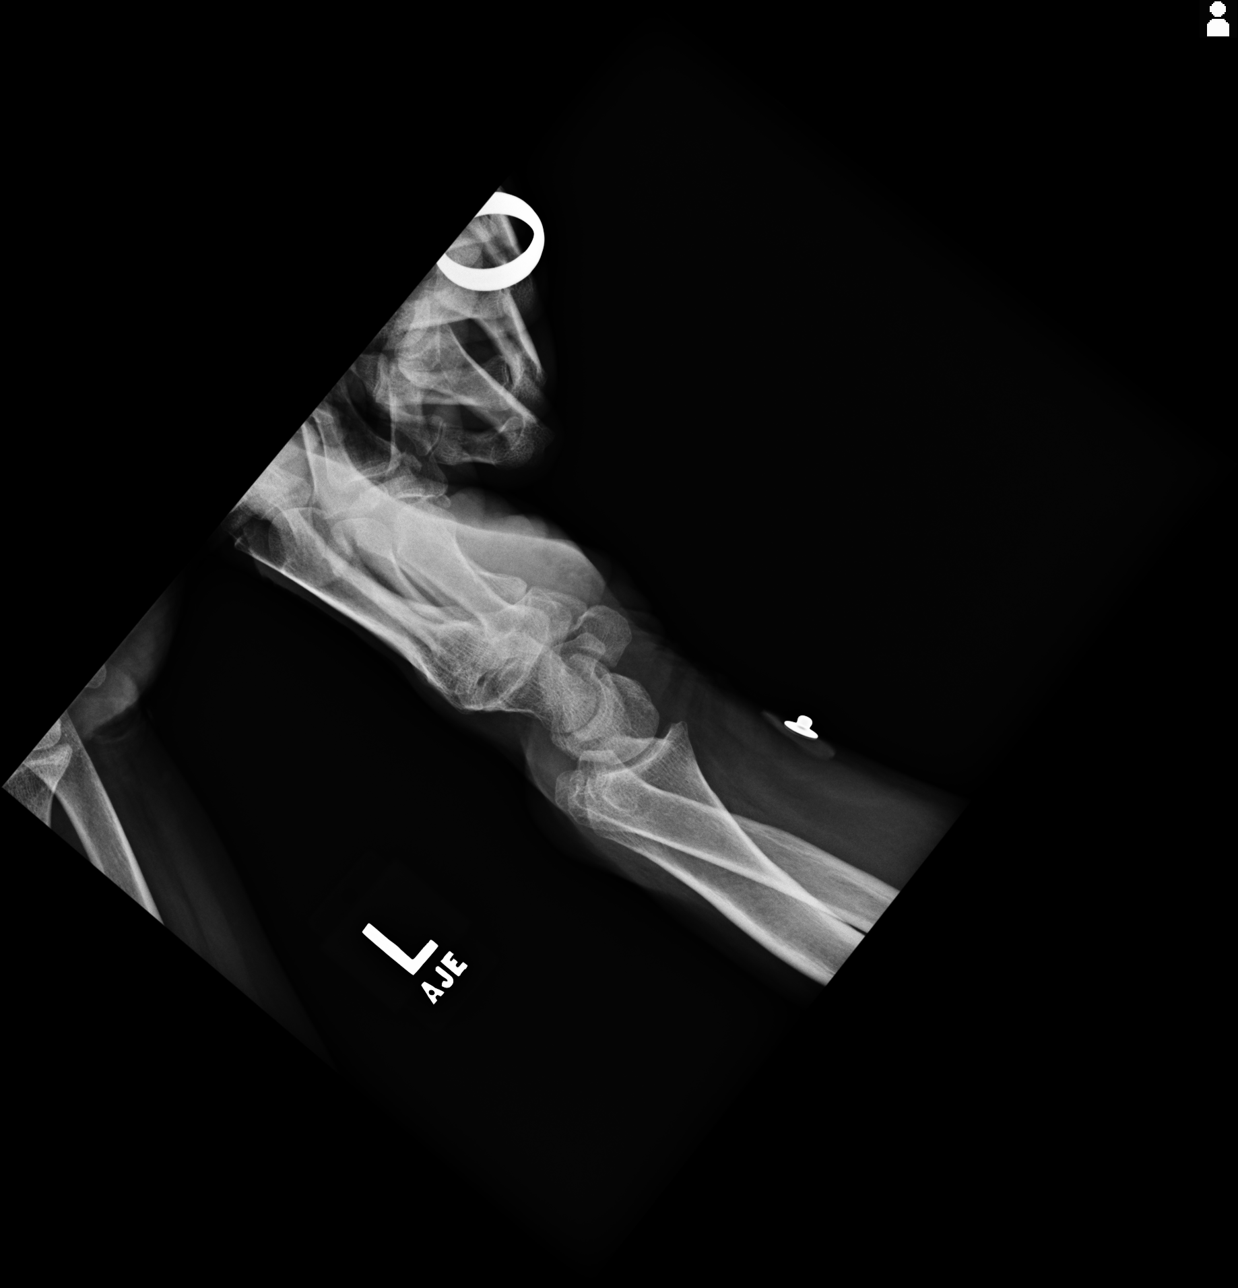

[3 of 3 positions shown; findings below may reference images not displayed]

FINDINGS: There is no evidence of fracture or dislocation. The carpal rows are
intact, and demonstrate normal alignment. The joint spaces are
preserved. Positive ulnar variance is noted.

No significant soft tissue abnormalities are seen.
IMPRESSION: 1. No evidence of fracture or dislocation.
2. Positive ulnar variance noted.

## 2015-02-14 ENCOUNTER — Other Ambulatory Visit: Payer: Self-pay | Admitting: Family Medicine

## 2015-08-18 DEATH — deceased
